# Patient Record
Sex: Female | Born: 1995
Health system: Southern US, Community
[De-identification: ages and names within clinical notes are randomized; demographics above are authoritative.]

---
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
## 2017-12-31 ENCOUNTER — Other Ambulatory Visit: Payer: Self-pay | Admitting: Obstetrics & Gynecology

## 2017-12-31 DIAGNOSIS — N6310 Unspecified lump in the right breast, unspecified quadrant: Secondary | ICD-10-CM

## 2018-01-01 ENCOUNTER — Ambulatory Visit
Admission: RE | Admit: 2018-01-01 | Discharge: 2018-01-01 | Disposition: A | Discharge status: Home / Self Care | Payer: BLUE CROSS/BLUE SHIELD | Source: Ambulatory Visit | Attending: Obstetrics & Gynecology | Admitting: Obstetrics & Gynecology

## 2018-01-01 DIAGNOSIS — N6310 Unspecified lump in the right breast, unspecified quadrant: Secondary | ICD-10-CM

## 2018-01-05 ENCOUNTER — Other Ambulatory Visit: Payer: Managed Care, Other (non HMO)

## 2018-01-12 ENCOUNTER — Other Ambulatory Visit: Payer: Managed Care, Other (non HMO)

## 2019-05-06 IMAGING — US ULTRASOUND RIGHT BREAST LIMITED
1 series · 2 of 2 positions shown · non-contrast
Comparison: None.

CLINICAL DATA: 22-year-old female describes a palpable lump within
the upper RIGHT breast, also felt by the referring physician.

EXAM:
ULTRASOUND OF THE RIGHT BREAST

[Series 1: ultrasound right breast limited · 0.06mm/px · 2 of 2 slices shown]
[im 1/2]
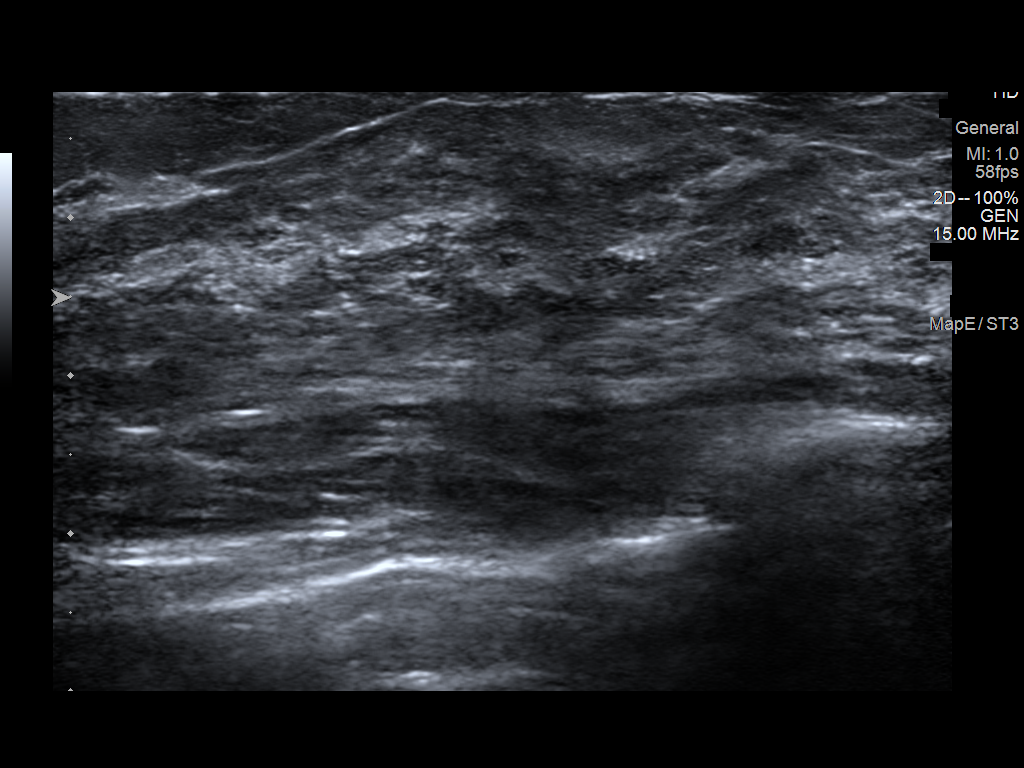
[im 2/2]
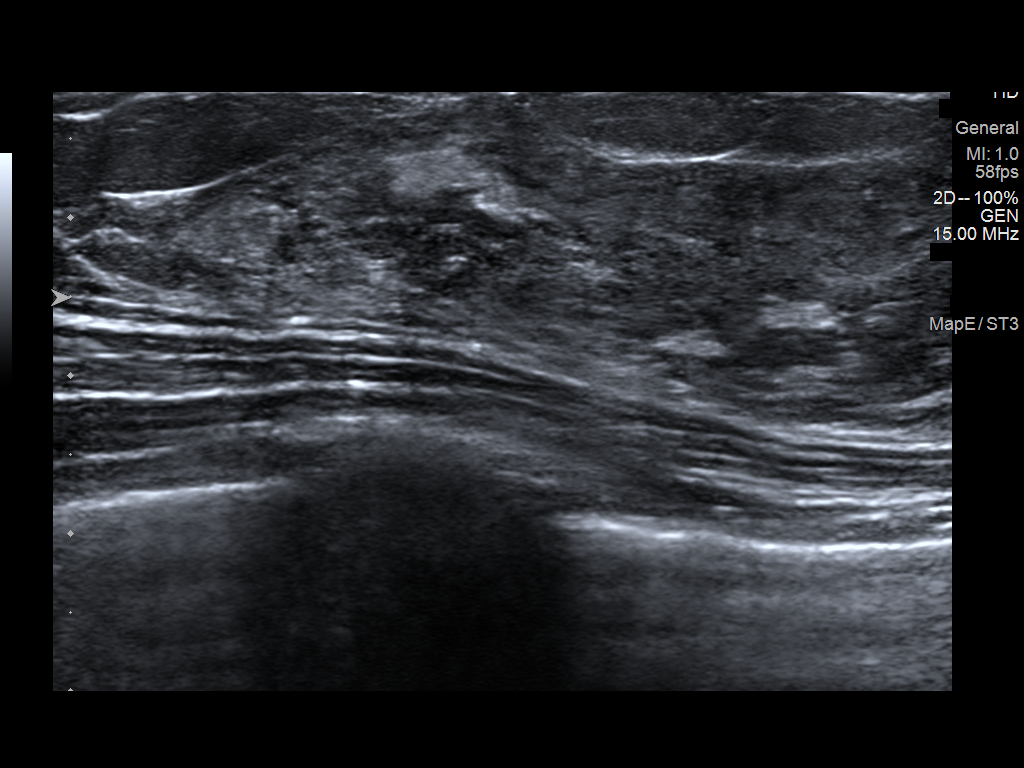

[2 of 2 positions shown; findings below may reference images not displayed]

FINDINGS: Targeted ultrasound is performed, evaluating the upper RIGHT breast
with particular attention to the 12 o'clock axis as directed by the
patient, showing only normal fibroglandular tissues and fat lobules
throughout. There is a ridge of normal fibroglandular tissue at the
12 o'clock axis, corresponding to the area of palpable concern. No
suspicious solid or cystic mass. No skin thickening or skin lesion.
IMPRESSION: No evidence of malignancy within the upper RIGHT breast. Ridge of
normal dense fibroglandular tissue at the 12 o'clock axis of the
RIGHT breast, corresponding to the area of clinical concern.

RECOMMENDATION:
1. Screening mammogram at age 42 unless there are persistent or
intervening clinical concerns. (Code:[id number]-W-[id number])
2. The patient was instructed to return sooner if the area that she
feels becomes larger and/or firmer to palpation, or if a new
palpable abnormality is identified in either breast.

I have discussed the findings and recommendations with the patient.
Results were also provided in writing at the conclusion of the
visit. If applicable, a reminder letter will be sent to the patient
regarding the next appointment.

BI-RADS CATEGORY  1: Negative.

## 2019-12-27 ENCOUNTER — Ambulatory Visit (HOSPITAL_COMMUNITY): Admit: 2019-12-27 | Disposition: A | Discharge status: Home / Self Care | Payer: Self-pay

## 2019-12-27 ENCOUNTER — Encounter (HOSPITAL_COMMUNITY): Payer: Self-pay | Admitting: Emergency Medicine

## 2019-12-27 ENCOUNTER — Ambulatory Visit (HOSPITAL_COMMUNITY)
Admission: EM | Admit: 2019-12-27 | Discharge: 2019-12-27 | Disposition: A | Discharge status: Home / Self Care | Payer: PRIVATE HEALTH INSURANCE | Attending: Urgent Care | Admitting: Urgent Care

## 2019-12-27 DIAGNOSIS — R059 Cough, unspecified: Secondary | ICD-10-CM | POA: Diagnosis present

## 2019-12-27 DIAGNOSIS — R52 Pain, unspecified: Secondary | ICD-10-CM | POA: Diagnosis not present

## 2019-12-27 DIAGNOSIS — U071 COVID-19: Secondary | ICD-10-CM | POA: Insufficient documentation

## 2019-12-27 DIAGNOSIS — J029 Acute pharyngitis, unspecified: Secondary | ICD-10-CM | POA: Diagnosis present

## 2019-12-27 DIAGNOSIS — B349 Viral infection, unspecified: Secondary | ICD-10-CM

## 2019-12-27 LAB — RESP PANEL BY RT-PCR (FLU A&B, COVID) ARPGX2
Influenza A by PCR: NEGATIVE
Influenza B by PCR: NEGATIVE
SARS Coronavirus 2 by RT PCR: POSITIVE — AB

## 2019-12-27 LAB — POCT RAPID STREP A, ED / UC: Streptococcus, Group A Screen (Direct): NEGATIVE

## 2019-12-27 MED ORDER — PROMETHAZINE-DM 6.25-15 MG/5ML PO SYRP: 5.0000 mL | ORAL_SOLUTION | Freq: Every evening | ORAL | 0 refills | Status: AC | PRN

## 2019-12-27 MED ORDER — BENZONATATE 100 MG PO CAPS: 100.0000 mg | ORAL_CAPSULE | Freq: Three times a day (TID) | ORAL | 0 refills | Status: AC | PRN

## 2019-12-27 NOTE — ED Provider Notes (Signed)
  [facility]   MRN: [id number] DOB: 02-28-1995  Subjective:   Jamie Bentley is a 24 y.o. female presenting for 2-day history of acute onset sinus headache, sore throat, cough, fever.  Patient is Covid and flu vaccinated.  Denies chest pain, shortness of breath.  Takes spironolactone.  Allergies  Allergen Reactions  . Penicillins Hives  . Zithromax [Azithromycin]     History reviewed. No pertinent past medical history.   History reviewed. No pertinent surgical history.  History reviewed. No pertinent family history.  Social History   Tobacco Use  . Smoking status: Never Smoker  . Smokeless tobacco: Never Used  Substance Use Topics  . Alcohol use: No  . Drug use: No    ROS   Objective:   Vitals: BP 99/64 (BP Location: Left Arm)   Pulse 96   Temp 100.2 F (37.9 C) (Oral)   Resp 17   SpO2 98%   Physical Exam Constitutional:      General: She is not in acute distress.    Appearance: Normal appearance. She is well-developed. She is not ill-appearing, toxic-appearing or diaphoretic.  HENT:     Head: Normocephalic and atraumatic.     Nose: Nose normal.     Mouth/Throat:     Mouth: Mucous membranes are moist.     Pharynx: No pharyngeal swelling, oropharyngeal exudate, posterior oropharyngeal erythema or uvula swelling.  Eyes:     Extraocular Movements: Extraocular movements intact.     Pupils: Pupils are equal, round, and reactive to light.  Cardiovascular:     Rate and Rhythm: Normal rate and regular rhythm.     Pulses: Normal pulses.     Heart sounds: Normal heart sounds. No murmur heard. No friction rub. No gallop.   Pulmonary:     Effort: Pulmonary effort is normal. No respiratory distress.     Breath sounds: Normal breath sounds. No stridor. No wheezing, rhonchi or rales.  Skin:    General: Skin is warm and dry.     Findings: No rash.  Neurological:     Mental Status: She is alert and oriented to person, place, and time.   Psychiatric:        Mood and Affect: Mood normal.        Behavior: Behavior normal.        Thought Content: Thought content normal.     Assessment and Plan :   PDMP not reviewed this encounter.  1. Viral syndrome   2. Sore throat   3. Cough   4. Body aches     Will manage for viral illness such as viral URI, viral syndrome, viral rhinitis, COVID-19. Counseled patient on nature of COVID-19 including modes of transmission, diagnostic testing, management and supportive care.  Offered scripts for symptomatic relief. COVID 19 testing is pending. Counseled patient on potential for adverse effects with medications prescribed/recommended today, ER and return-to-clinic precautions discussed, patient verbalized understanding.     Wallis Bamberg, [place] 12/27/19 [phone]

## 2019-12-27 NOTE — ED Notes (Signed)
Called pt to come inside, states she will be about 5 minutes, advised that we have to continue calling other patients that are still in the building.

## 2019-12-27 NOTE — ED Triage Notes (Signed)
Pt presents with sore throat, dry cough, and headache xs 2 days.

## 2019-12-27 NOTE — Discharge Instructions (Addendum)

## 2019-12-30 LAB — CULTURE, GROUP A STREP (THRC)
